# Patient Record
Sex: Male | Born: 1975 | Race: White | Hispanic: No | Marital: Married | Smoking: Current some day smoker
Health system: Southern US, Community
[De-identification: ages and names within clinical notes are randomized; demographics above are authoritative.]

---

## 2017-03-21 DIAGNOSIS — R2242 Localized swelling, mass and lump, left lower limb: Secondary | ICD-10-CM | POA: Insufficient documentation

## 2017-03-21 DIAGNOSIS — F17200 Nicotine dependence, unspecified, uncomplicated: Secondary | ICD-10-CM | POA: Insufficient documentation

## 2017-03-21 DIAGNOSIS — M25532 Pain in left wrist: Secondary | ICD-10-CM | POA: Insufficient documentation

## 2017-03-21 LAB — COMPREHENSIVE METABOLIC PANEL
ALBUMIN: 4.4 g/dL (ref 3.5–5.0)
ALK PHOS: 90 U/L (ref 38–126)
ALT: 45 U/L (ref 17–63)
AST: 26 U/L (ref 15–41)
Anion gap: 8 (ref 5–15)
BUN: 10 mg/dL (ref 6–20)
CALCIUM: 9.2 mg/dL (ref 8.9–10.3)
CO2: 29 mmol/L (ref 22–32)
CREATININE: 0.9 mg/dL (ref 0.61–1.24)
Chloride: 102 mmol/L (ref 101–111)
GFR calc non Af Amer: >60 mL/min (ref >60)
GLUCOSE: 131 mg/dL — AB (ref 65–99)
Potassium: 3.6 mmol/L (ref 3.5–5.1)
SODIUM: 139 mmol/L (ref 135–145)
Total Bilirubin: 0.9 mg/dL (ref 0.3–1.2)
Total Protein: 8 g/dL (ref 6.5–8.1)

## 2017-03-21 LAB — CBC WITH DIFFERENTIAL/PLATELET
BASOS PCT: 1 %
Basophils Absolute: 0.1 10*3/uL (ref 0–0.1)
EOS ABS: 0.1 10*3/uL (ref 0–0.7)
EOS PCT: 1 %
HCT: 46 % (ref 40.0–52.0)
Hemoglobin: 16.1 g/dL (ref 13.0–18.0)
Lymphocytes Relative: 36 %
Lymphs Abs: 4.2 10*3/uL — ABNORMAL HIGH (ref 1.0–3.6)
MCH: 31.7 pg (ref 26.0–34.0)
MCHC: 35 g/dL (ref 32.0–36.0)
MCV: 90.5 fL (ref 80.0–100.0)
MONO ABS: 0.8 10*3/uL (ref 0.2–1.0)
MONOS PCT: 6 %
Neutro Abs: 6.7 10*3/uL — ABNORMAL HIGH (ref 1.4–6.5)
Neutrophils Relative %: 56 %
Platelets: 226 10*3/uL (ref 150–440)
RBC: 5.09 MIL/uL (ref 4.40–5.90)
RDW: 13.1 % (ref 11.5–14.5)
WBC: 11.8 10*3/uL — ABNORMAL HIGH (ref 3.8–10.6)

## 2017-03-21 NOTE — ED Triage Notes (Addendum)
Pt states that his left wrist is swollen, numb, tingling, and shooting pain down the arm. Began yesterday. States that he has a habit of sleeping on the arm causing it to swell in the past but he doesn't think that is the case this time. Pt alert and oriented. Arm looks red and is warm to the touch. Pt has a moderate radial pulse.

## 2017-03-22 ENCOUNTER — Emergency Department: Payer: Self-pay

## 2017-03-22 ENCOUNTER — Emergency Department
Admission: EM | Admit: 2017-03-22 | Discharge: 2017-03-22 | Disposition: A | Discharge status: Home / Self Care | Payer: Self-pay | Attending: Emergency Medicine | Admitting: Emergency Medicine

## 2017-03-22 DIAGNOSIS — L03114 Cellulitis of left upper limb: Secondary | ICD-10-CM

## 2017-03-22 DIAGNOSIS — M25532 Pain in left wrist: Secondary | ICD-10-CM

## 2017-03-22 LAB — URIC ACID: Uric Acid, Serum: 6.9 mg/dL (ref 4.4–7.6)

## 2017-03-22 MED ORDER — CEPHALEXIN 500 MG PO CAPS
500.0000 mg | ORAL_CAPSULE | Freq: Once | ORAL | Status: AC
  Administered 2017-03-22: 500 mg via ORAL
  Filled 2017-03-22: qty 1

## 2017-03-22 MED ORDER — HYDROCODONE-ACETAMINOPHEN 5-325 MG PO TABS: 1.0000 | ORAL_TABLET | ORAL | 0 refills | Status: AC | PRN

## 2017-03-22 MED ORDER — CEPHALEXIN 500 MG PO CAPS: 500.0000 mg | ORAL_CAPSULE | Freq: Three times a day (TID) | ORAL | 0 refills | Status: AC

## 2017-03-22 MED ORDER — HYDROCODONE-ACETAMINOPHEN 5-325 MG PO TABS
2.0000 | ORAL_TABLET | Freq: Once | ORAL | Status: AC
  Administered 2017-03-22: 2 via ORAL
  Filled 2017-03-22: qty 2

## 2017-03-22 NOTE — Discharge Instructions (Signed)
Please take your antibiotics for their entire course. Please wear your wrist splint throughout the day. He may use ice or heat as needed for discomfort every 2-3 hours. He may use Tylenol or ibuprofen at home for pain relief, you may use your prescribed pain medication when needed for severe pain. Do not drive or operate machinery or drink alcohol while taking the pain medication. If your wrist pain continues to worsen, redness expands or you develop a fever please follow-up with orthopedics by calling the number provided to arrange a follow-up appointment this coming week.

## 2017-03-22 NOTE — ED Provider Notes (Addendum)
[facility] Emergency Department Provider Note  Time seen: 12:36 AM  I have reviewed the triage vital signs and the nursing notes.   HISTORY  Chief Complaint No chief complaint on file.    HPI Allen Jimenez is a 41 y.o. male with no past medical history who presents the emergency department for left upper extremity pain and swelling. According to the patient yesterday morning he awoke with left wrist pain. He states the pain has worsened over the past 2 days along with mild swelling of the wrist and hand. States a slight tingling sensation in the tips of his fingers as well. Denies any history of blood clots. Denies any known trauma. 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 Denies fever. Denies chest pain or shortness of breath.  History reviewed. No pertinent past medical history.  There are no active problems to display for this patient.   History reviewed. No pertinent surgical history.  Prior to Admission medications   Not on File    Not on File  History reviewed. No pertinent family history.  Social History Social History  Substance Use Topics  . Smoking status: Current Some Day Smoker  . Smokeless tobacco: Never Used  . Alcohol use Yes    Review of Systems Constitutional: Negative for fever. Cardiovascular: Negative for chest pain. Respiratory: Negative for shortness of breath. Gastrointestinal: Negative for abdominal pain Musculoskeletal:Left wrist pain Skin: Mild erythema of the dorsal aspect of the left hand All other ROS negative  ____________________________________________   PHYSICAL EXAM:  VITAL SIGNS: ED Triage Vitals  Enc Vitals Group     BP 03/21/17 2152 138/86     Pulse Rate 03/21/17 2152 93     Resp 03/21/17 2152 18     Temp 03/21/17 2152 98.3 F (36.8 C)     Temp Source 03/21/17 2152 Oral     SpO2 03/21/17 2152 99 %   Weight 03/21/17 2153 187 lb (84.8 kg)     Height 03/21/17 2153 5\' 8"  (1.727 m)     Head Circumference --      Peak Flow --      Pain Score 03/21/17 2152 7     Pain Loc --      Pain Edu? --      Excl. in GC? --     Constitutional: Alert and oriented. Well appearing and in no distress. Eyes: Normal exam ENT   Head: Normocephalic and atraumatic.   Mouth/Throat: Mucous membranes are moist. Cardiovascular: Normal rate, regular rhythm. No murmur Respiratory: Normal respiratory effort without tachypnea nor retractions. Breath sounds are clear  Gastrointestinal: Soft and nontender. No distention.   Musculoskeletal: Mild edema of the distal forearm, wrist and dorsal aspect of the hand, on the left side. Mild erythema to the dorsal aspect of the left hand. Moderate tenderness to the dorsal aspect the left hand and wrist. Mild to moderate pain with range of motion of the wrist. Normal capillary refill. 2+ radial pulse. Sensation intact. Neurologic:  Normal speech and language. No gross focal neurologic deficits  Skin:  Skin is warm, dry and intact. Mild erythema of the dorsal aspect of the left hand. Psychiatric: Mood and affect are normal.   ____________________________________________   RADIOLOGY  Ultrasound negative X-ray negative besides mild swelling  ____________________________________________   INITIAL IMPRESSION / ASSESSMENT AND PLAN / ED COURSE  Pertinent labs & imaging  results that were available during my care of the patient were reviewed by me and considered in my medical decision making (see chart for details).  The patient presents to the emergency department with pain and swelling of the left wrist/hand since yesterday morning. Denies fever, chest pain or trouble breathing. On exam the patient has mild edema and mild erythema. 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 We will obtain an x-ray as well as an  ultrasound to rule out DVT. I will also add on a uric acid level although no history of gout previously.  Ultrasound negative for DVT, x-ray negative for fracture. Given mild swelling with mild erythema we will cover with Keflex for possible cellulitis. We will place in a Velcro removable wrist splint for comfort. We'll have the patient follow up with orthopedics if not improved in the next several days.  ____________________________________________   FINAL CLINICAL IMPRESSION(S) / ED DIAGNOSES  Left wrist pain Cellulitis    Minna AntisPaduchowski, Mashal Slavick, MD 03/22/17 Jackey [id number]    Minna AntisPaduchowski, Miosotis Wetsel, MD 03/22/17 [phone]

## 2018-06-10 IMAGING — US US EXTREM  UP VENOUS*L*
1 series · 13 of 24 positions shown · non-contrast
Comparison: None.

CLINICAL DATA: Initial evaluation for acute left wrist pain,
swelling.



[Series 1: us extrem up venous*left* · 0.08mm/px · 13 of 32 slices shown]
[im 1/32]
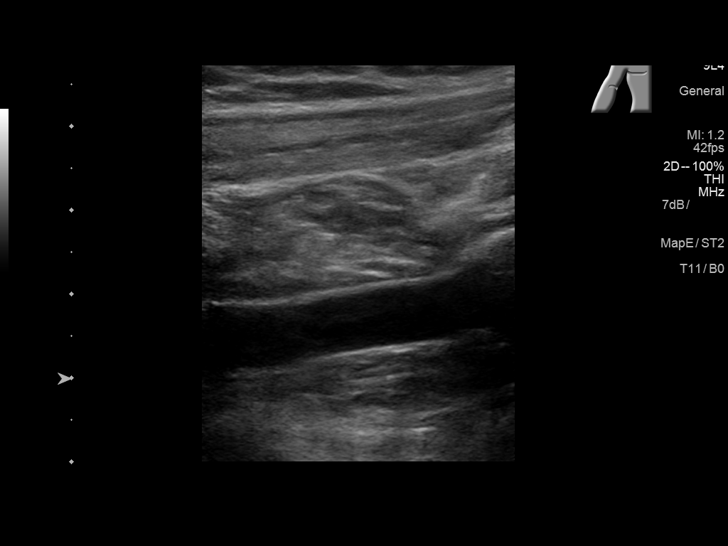
[im 3/32]
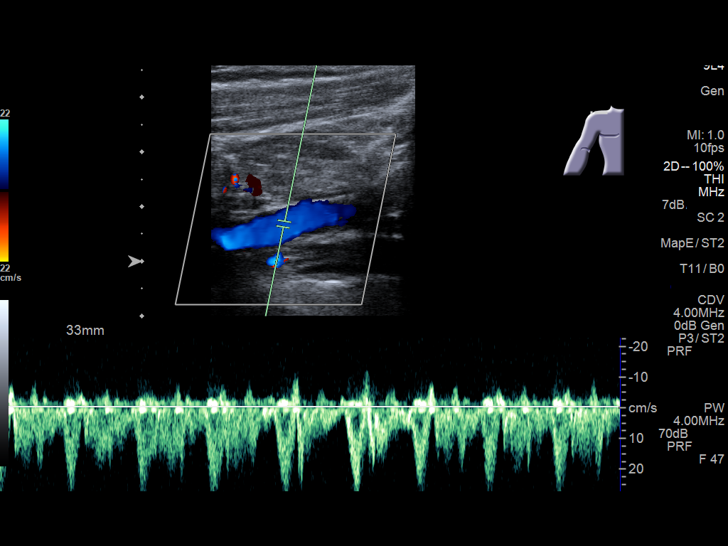
[im 6/32]
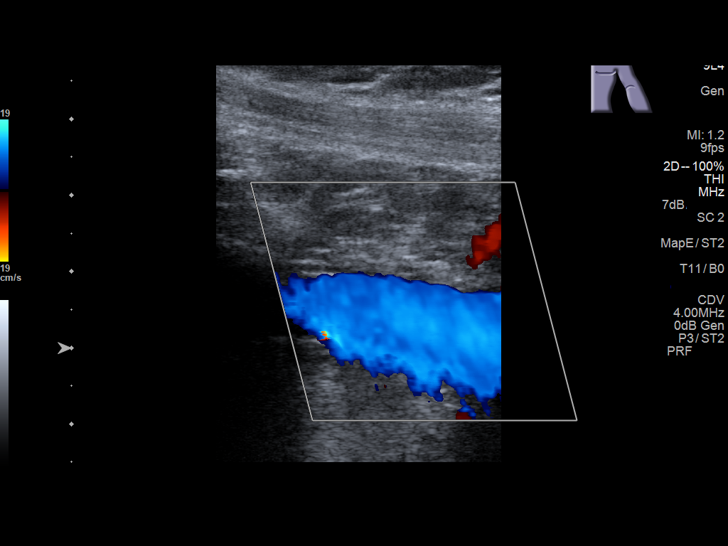
[im 9/32]
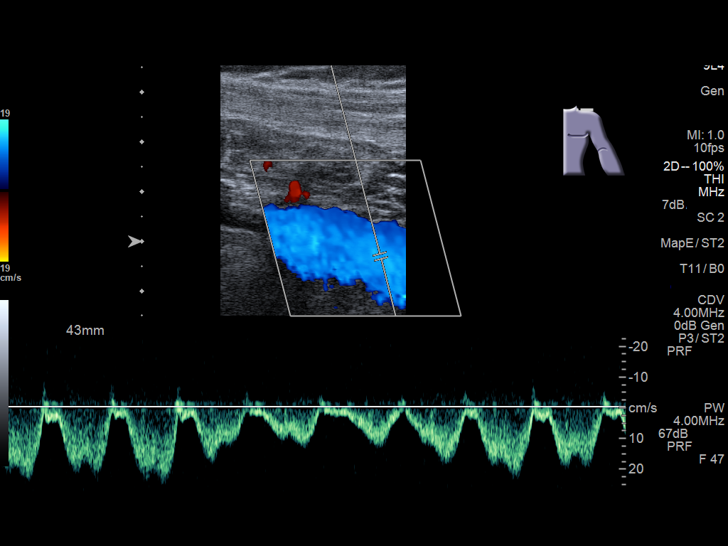
[im 11/32]
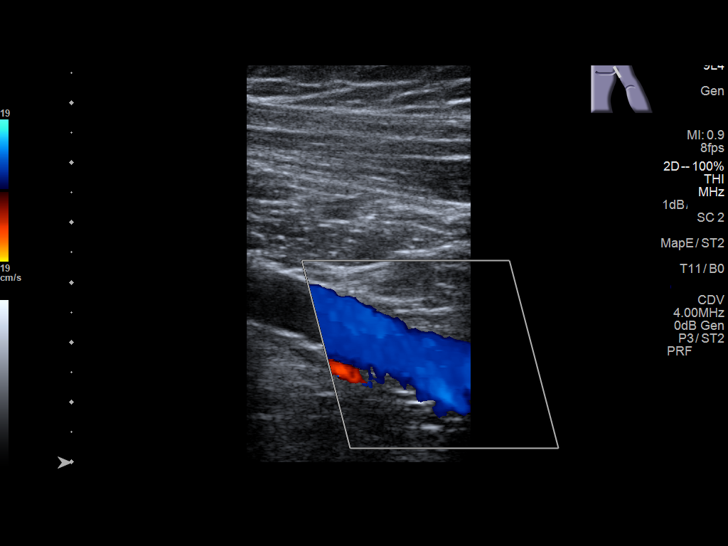
[im 14/32]
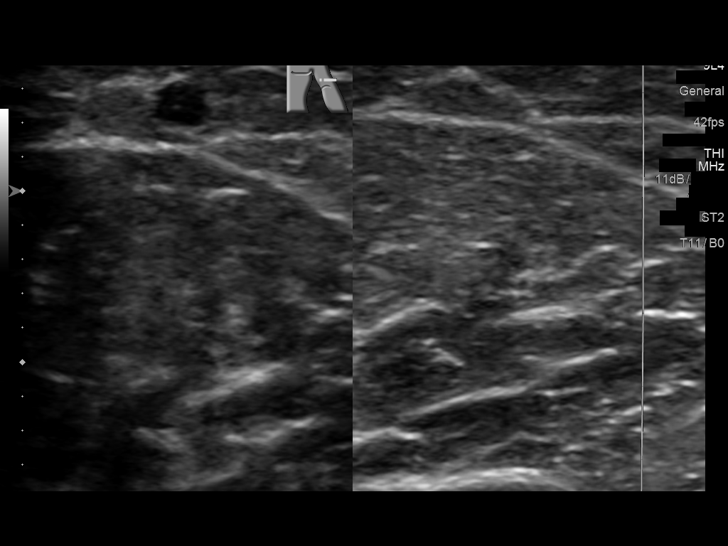
[im 17/32]
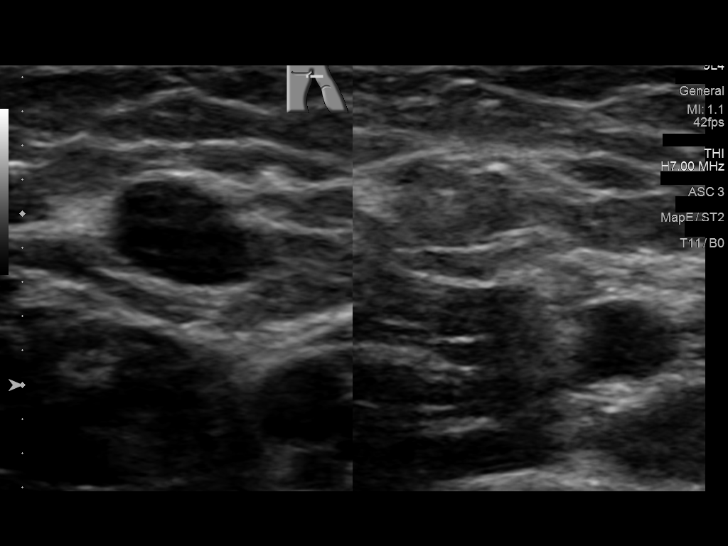
[im 18/32]
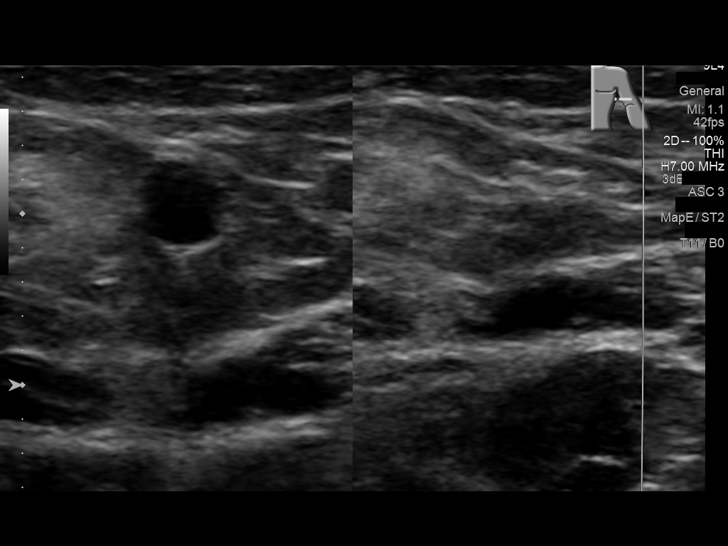
[im 21/32]
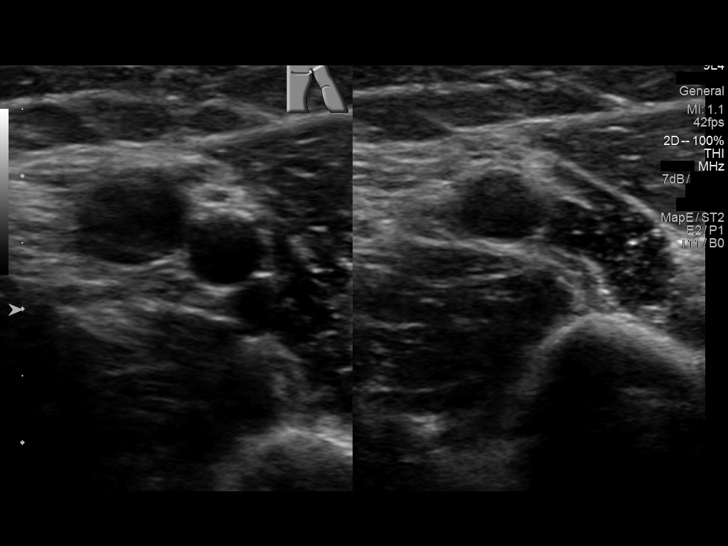
[im 23/32]
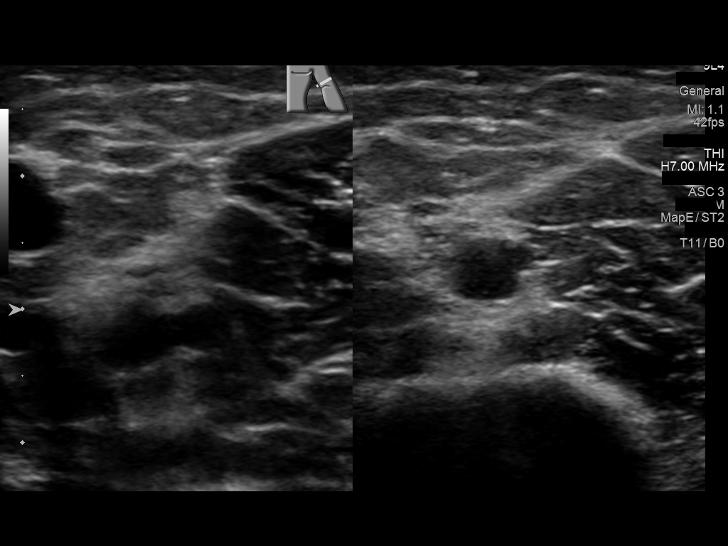
[im 26/32]
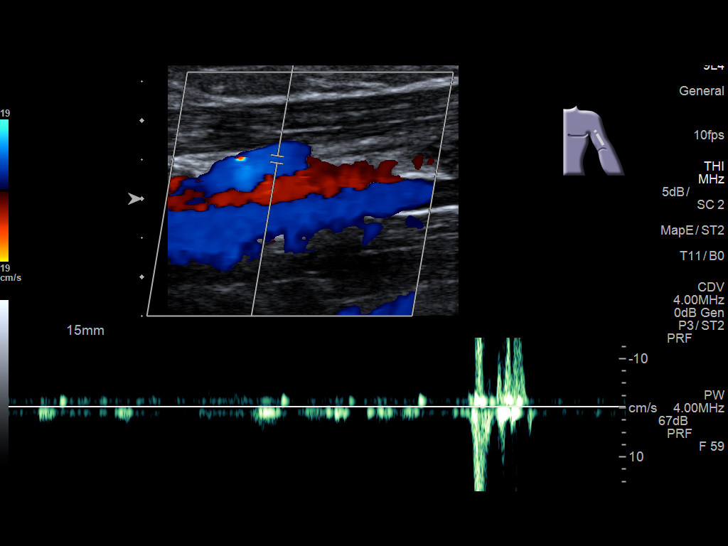
[im 29/32]
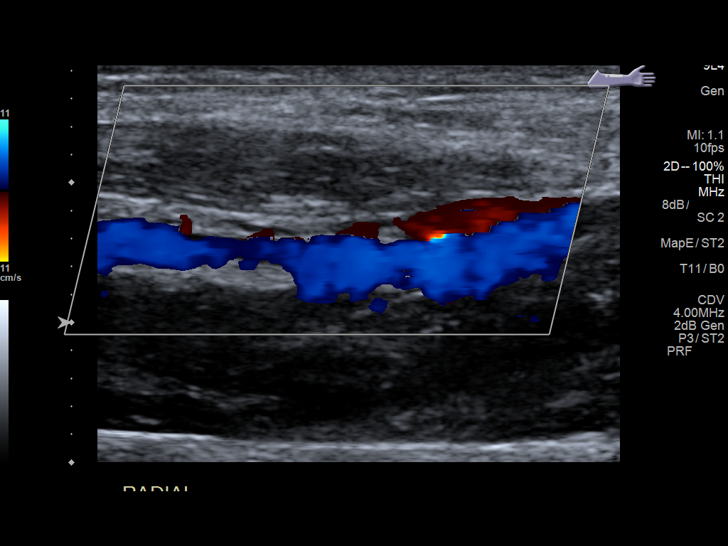
[im 32/32]
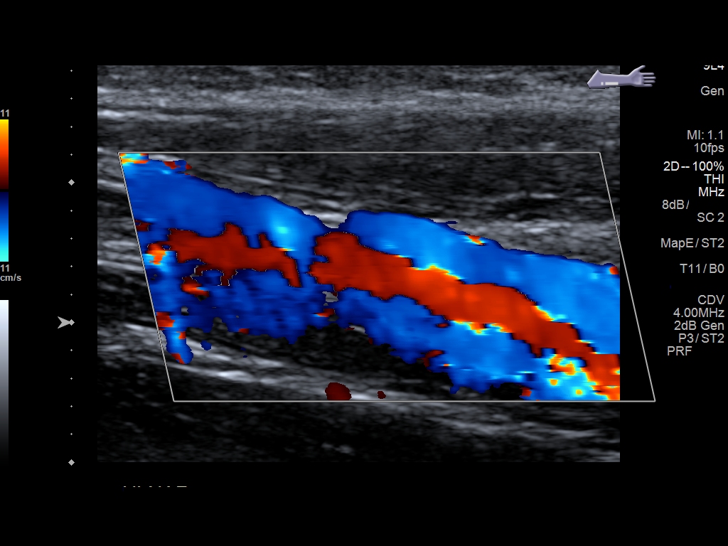

[13 of 24 positions shown; findings below may reference images not displayed]

FINDINGS: Contralateral Subclavian Vein: Respiratory phasicity is normal and
symmetric with the symptomatic side. No evidence of thrombus. Normal
compressibility.

Internal Jugular Vein: No evidence of thrombus. Normal
compressibility, respiratory phasicity and response to augmentation.

Subclavian Vein: No evidence of thrombus. Normal compressibility,
respiratory phasicity and response to augmentation.

Axillary Vein: No evidence of thrombus. Normal compressibility,
respiratory phasicity and response to augmentation.

Cephalic Vein: No evidence of thrombus. Normal compressibility,
respiratory phasicity and response to augmentation.

Basilic Vein: No evidence of thrombus. Normal compressibility,
respiratory phasicity and response to augmentation.

Brachial Veins: No evidence of thrombus. Normal compressibility,
respiratory phasicity and response to augmentation.

Radial Veins: No evidence of thrombus. Normal compressibility,
respiratory phasicity and response to augmentation.

Ulnar Veins: No evidence of thrombus. Normal compressibility,
respiratory phasicity and response to augmentation.

Venous Reflux:  None visualized.

Other Findings:  None visualized.
IMPRESSION: No evidence of DVT within the left upper extremity.

## 2018-09-09 IMAGING — DX DG WRIST COMPLETE 3+V*L*
4 series · 4 of 4 positions shown · non-contrast
Comparison: None.

CLINICAL DATA: Left wrist swelling, numbness and tingling beginning
yesterday.

EXAM:
LEFT WRIST - COMPLETE 3+ VIEW

[wrist ap (1 of 2)]
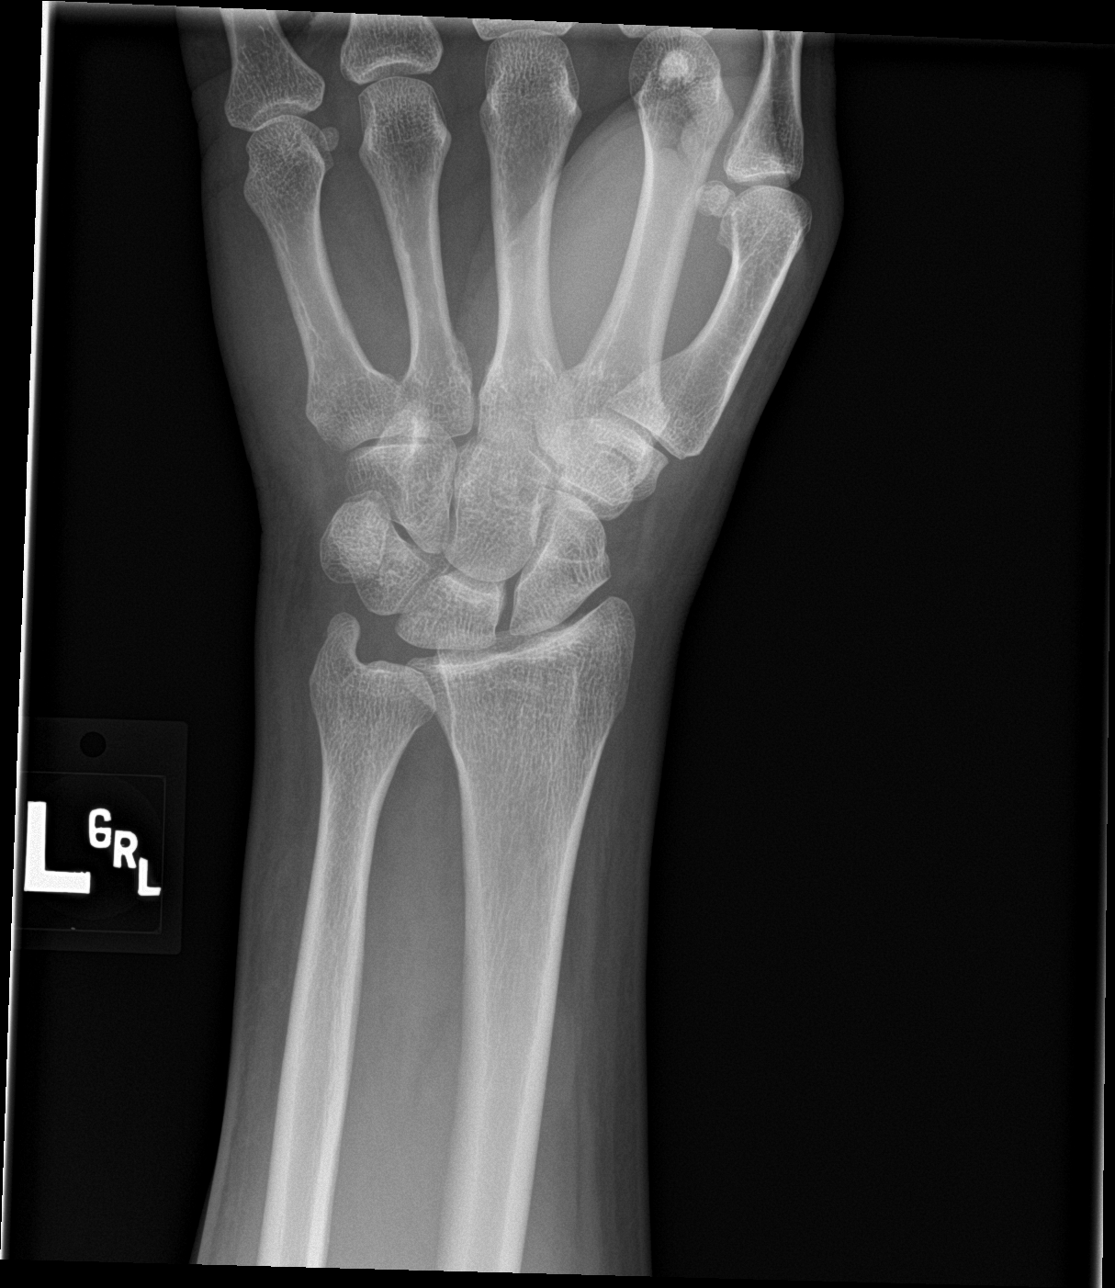

[wrist obl]
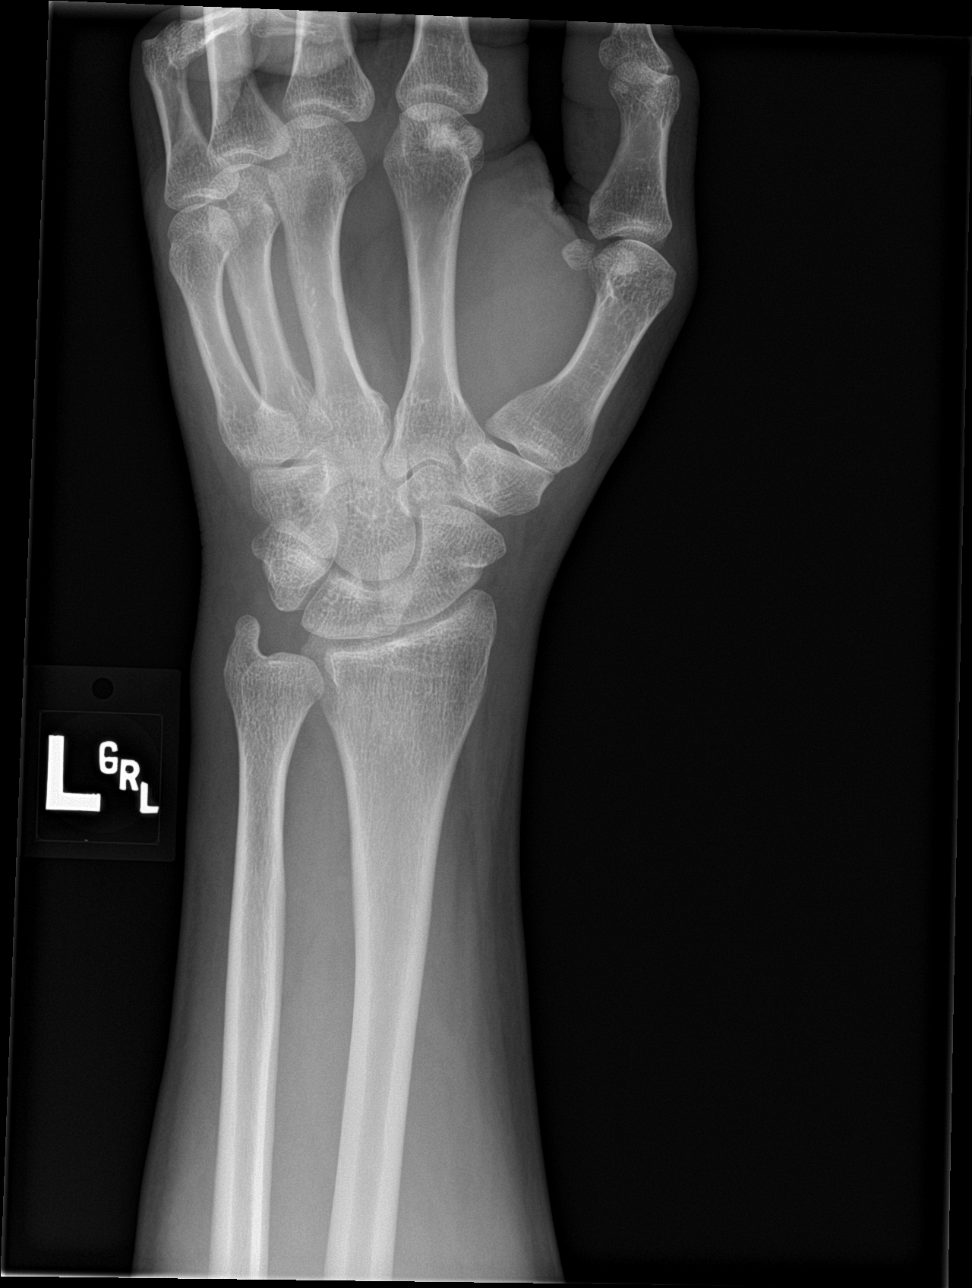

[wrist lat]
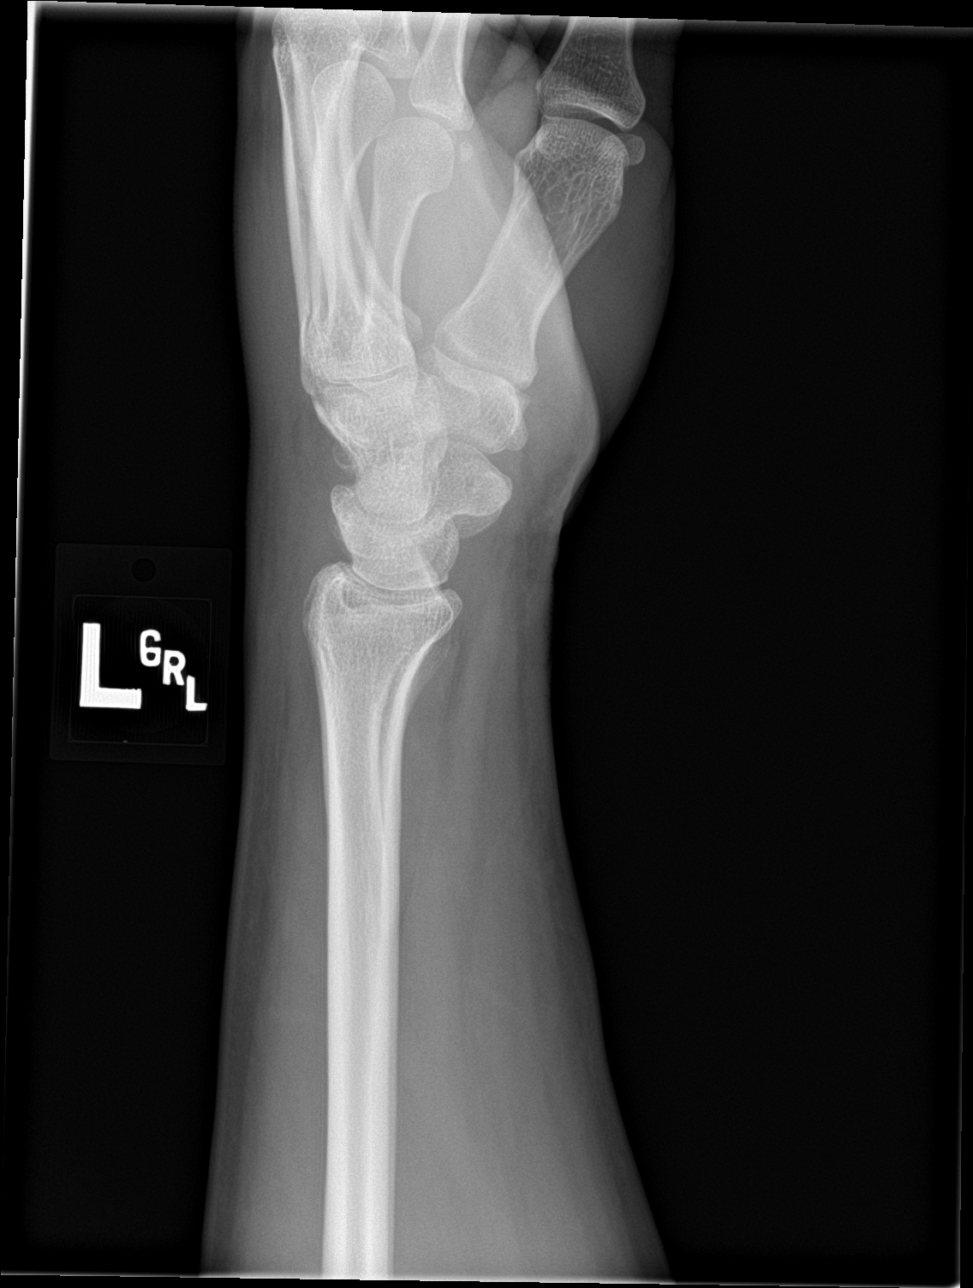

[wrist ap (2 of 2)]
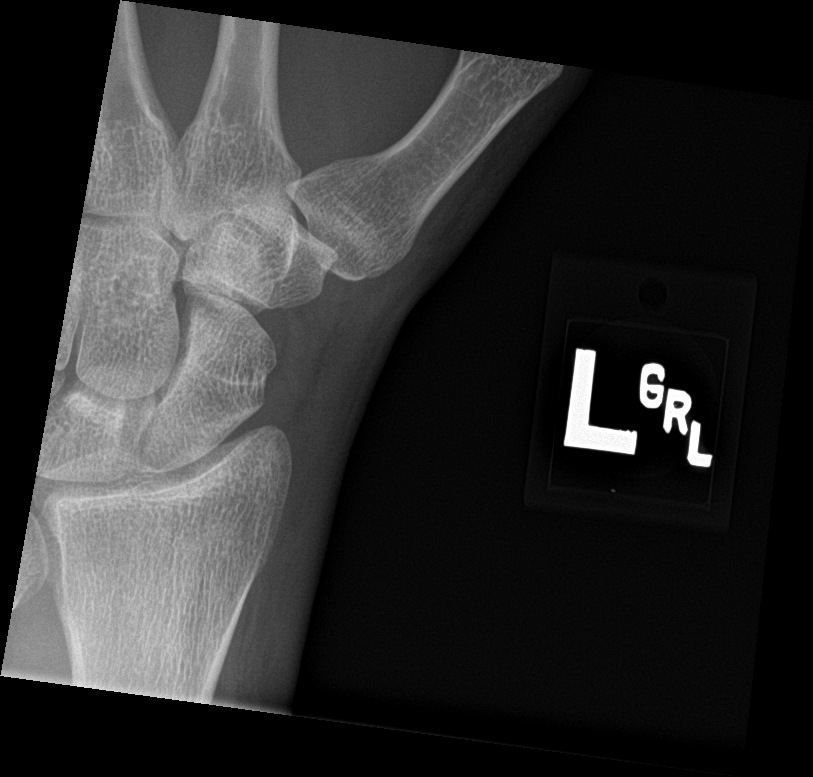

[4 of 4 positions shown; findings below may reference images not displayed]

FINDINGS: There is no evidence of fracture or dislocation. There is no
evidence of arthropathy or other focal bone abnormality. Mild soft
tissue swelling is seen about wrist more so along the lateral and
dorsal aspect. There is soft tissue swelling over the dorsum of the
hand as well.
IMPRESSION: Soft tissue swelling of the wrist without acute fracture or
malalignment.
